# Patient Record
Sex: Male | Born: 1995 | Race: Black or African American | Hispanic: No | Marital: Single | Smoking: Never smoker
Health system: Southern US, Community
[De-identification: ages and names within clinical notes are randomized; demographics above are authoritative.]

---
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
## 2013-02-22 ENCOUNTER — Emergency Department (HOSPITAL_COMMUNITY): Payer: Medicaid Other

## 2013-02-22 ENCOUNTER — Encounter (HOSPITAL_COMMUNITY): Payer: Self-pay | Admitting: Emergency Medicine

## 2013-02-22 ENCOUNTER — Emergency Department (HOSPITAL_COMMUNITY)
Admission: EM | Admit: 2013-02-22 | Discharge: 2013-02-22 | Disposition: A | Discharge status: Home / Self Care | Payer: Medicaid Other | Attending: Emergency Medicine | Admitting: Emergency Medicine

## 2013-02-22 DIAGNOSIS — S41131A Puncture wound without foreign body of right upper arm, initial encounter: Secondary | ICD-10-CM

## 2013-02-22 DIAGNOSIS — F411 Generalized anxiety disorder: Secondary | ICD-10-CM | POA: Insufficient documentation

## 2013-02-22 DIAGNOSIS — S41109A Unspecified open wound of unspecified upper arm, initial encounter: Secondary | ICD-10-CM | POA: Insufficient documentation

## 2013-02-22 LAB — CBC WITH DIFFERENTIAL/PLATELET
Basophils Absolute: 0.1 10*3/uL (ref 0.0–0.1)
Basophils Relative: 1 % (ref 0–1)
Hemoglobin: 15.9 g/dL (ref 13.0–17.0)
Lymphocytes Relative: 31 % (ref 12–46)
MCHC: 34.4 g/dL (ref 30.0–36.0)
Monocytes Relative: 5 % (ref 3–12)
Neutro Abs: 4.6 10*3/uL (ref 1.7–7.7)
Neutrophils Relative %: 60 % (ref 43–77)
RDW: 13 % (ref 11.5–15.5)
WBC: 7.6 10*3/uL (ref 4.0–10.5)

## 2013-02-22 LAB — BASIC METABOLIC PANEL
Chloride: 98 mEq/L (ref 96–112)
GFR calc Af Amer: >90 mL/min (ref >90)
Potassium: 3.5 mEq/L (ref 3.5–5.1)

## 2013-02-22 MED ORDER — CEPHALEXIN 500 MG PO CAPS: 500.0000 mg | ORAL_CAPSULE | Freq: Two times a day (BID) | ORAL | Status: DC

## 2013-02-22 MED ORDER — SODIUM CHLORIDE 0.9 % IV BOLUS (SEPSIS)
1000.0000 mL | Freq: Once | INTRAVENOUS | Status: AC
  Administered 2013-02-22: 1000 mL via INTRAVENOUS

## 2013-02-22 MED ORDER — FENTANYL CITRATE 0.05 MG/ML IJ SOLN
50.0000 ug | Freq: Once | INTRAMUSCULAR | Status: AC
  Administered 2013-02-22: 50 ug via INTRAVENOUS
  Filled 2013-02-22: qty 2

## 2013-02-22 MED ORDER — IOHEXOL 350 MG/ML SOLN
100.0000 mL | Freq: Once | INTRAVENOUS | Status: AC | PRN
  Administered 2013-02-22: 100 mL via INTRAVENOUS

## 2013-02-22 MED ORDER — HYDROCODONE-ACETAMINOPHEN 5-325 MG PO TABS: 1.0000 | ORAL_TABLET | ORAL | Status: DC | PRN
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
## 2013-02-22 NOTE — ED Notes (Signed)
Pt back from CT, no pain, VSS, PD at bedside
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
## 2013-02-22 NOTE — ED Notes (Signed)
Bulk dressing applied to R upper arm.  Bleeding controlled.  No distress noted.

## 2013-02-22 NOTE — ED Notes (Signed)
NAD noted at time of d/c home 

## 2013-02-23 LAB — TYPE AND SCREEN
ABO/RH(D): B POS
DAT, IgG: NEGATIVE
Unit division: 0

## 2013-05-27 ENCOUNTER — Encounter (HOSPITAL_COMMUNITY): Payer: Self-pay | Admitting: Emergency Medicine

## 2013-05-27 ENCOUNTER — Emergency Department (HOSPITAL_COMMUNITY)
Admission: EM | Admit: 2013-05-27 | Discharge: 2013-05-27 | Disposition: A | Discharge status: Home / Self Care | Payer: Self-pay | Attending: Emergency Medicine | Admitting: Emergency Medicine

## 2013-05-27 DIAGNOSIS — K089 Disorder of teeth and supporting structures, unspecified: Secondary | ICD-10-CM | POA: Insufficient documentation

## 2013-05-27 DIAGNOSIS — K0889 Other specified disorders of teeth and supporting structures: Secondary | ICD-10-CM

## 2013-05-27 MED ORDER — TRAMADOL HCL 50 MG PO TABS: 50.0000 mg | ORAL_TABLET | Freq: Four times a day (QID) | ORAL | Status: DC | PRN

## 2013-05-27 MED ORDER — PENICILLIN V POTASSIUM 500 MG PO TABS: 500.0000 mg | ORAL_TABLET | Freq: Four times a day (QID) | ORAL | Status: AC

## 2013-05-27 NOTE — ED Notes (Signed)
Patient with history of toothache, upper right jaw.  Patient states that the tooth is broken.  Increased pain at this time.

## 2013-05-27 NOTE — ED Provider Notes (Signed)
   History    CSN: [id number] Arrival date & time 05/27/13  6213  First MD Initiated Contact with Patient 05/27/13 0324     Chief Complaint  Patient presents with  . Dental Pain   HPI  History provided by the patient. Patient is a 17 year old male with no significant PMH presenting with complaints of worsened right upper molar pain. Patient states he has had part of his mold broken for several months. He has had occasional intermittent discomforts since that time they gradually worsened over the past one or 2 days. Patient attempted to use over-the-counter dental filling to place in the broken area. He states this has only helped slightly. He has been taking Aleve regularly or ibuprofen for states this has not helped with pain tonight and this morning. He denies any associated fever, chills or sweats. No swelling of the face. No difficulty breathing or swallowing. No other aggravating or alleviating factors. No other associated symptoms.     History reviewed. No pertinent past medical history. History reviewed. No pertinent past surgical history. No family history on file. History  Substance Use Topics  . Smoking status: Never Smoker   . Smokeless tobacco: Not on file  . Alcohol Use: No    Review of Systems  Constitutional: Negative for fever, chills and diaphoresis.  All other systems reviewed and are negative.    Allergies  Review of patient's allergies indicates no known allergies.  Home Medications   Current Outpatient Rx  Name  Route  Sig  Dispense  Refill  . ibuprofen (ADVIL,MOTRIN) 200 MG tablet   Oral   Take 200-400 mg by mouth every 6 (six) hours as needed for pain.         . Naproxen Sodium (ALEVE PO)   Oral   Take 1-2 tablets by mouth every 12 (twelve) hours as needed (pain).          BP 120/80  Pulse 66  Temp(Src) 98.1 F (36.7 C) (Oral)  Resp 16  SpO2 100% Physical Exam  Nursing note and vitals reviewed. Constitutional: He is oriented to  person, place, and time. He appears well-developed and well-nourished. No distress.  HENT:  Head: Normocephalic.  Mouth/Throat:    Pain percussion of the right upper first molar. There is a white semisolid over-the-counter product placed within the molar. Adjacent gums without any significant swelling.  Neck: Normal range of motion. Neck supple.  Cardiovascular: Normal rate and regular rhythm.   Pulmonary/Chest: Effort normal and breath sounds normal.  Abdominal: Soft.  Lymphadenopathy:    He has no cervical adenopathy.  Neurological: He is alert and oriented to person, place, and time.  Skin: Skin is warm.  Psychiatric: He has a normal mood and affect. His behavior is normal.    ED Course  Procedures   Dental Block Performed by: Angus Seller Authorized by: Angus Seller Consent: Verbal consent obtained. Risks and benefits: risks, benefits and alternatives were discussed Consent given by: patient Patient identity confirmed: provided demographic data  Location: Right upper first molar  Local anesthetic: Bupivacaine 0.5% with epinephrine  Anesthetic total: 1.8 ml  Irrigation method: syringe  Patient tolerance: Patient tolerated the procedure well with no immediate complications. Pain improved.    1. Pain, dental     MDM  Patient seen and evaluated. Patient appears well in no acute distress.  Angus Seller, PA-C 05/27/13 [phone]

## 2013-05-27 NOTE — ED Provider Notes (Signed)
Medical screening examination/treatment/procedure(s) were performed by non-physician practitioner and as supervising physician I was immediately available for consultation/collaboration.   Layan Zalenski, MD 05/27/13 0641 

## 2014-07-26 ENCOUNTER — Emergency Department (HOSPITAL_BASED_OUTPATIENT_CLINIC_OR_DEPARTMENT_OTHER)
Admission: EM | Admit: 2014-07-26 | Discharge: 2014-07-26 | Disposition: A | Discharge status: Home / Self Care | Payer: BC Managed Care – PPO | Attending: Emergency Medicine | Admitting: Emergency Medicine

## 2014-07-26 ENCOUNTER — Emergency Department (HOSPITAL_BASED_OUTPATIENT_CLINIC_OR_DEPARTMENT_OTHER): Payer: BC Managed Care – PPO

## 2014-07-26 ENCOUNTER — Encounter (HOSPITAL_BASED_OUTPATIENT_CLINIC_OR_DEPARTMENT_OTHER): Payer: Self-pay | Admitting: Emergency Medicine

## 2014-07-26 DIAGNOSIS — J069 Acute upper respiratory infection, unspecified: Secondary | ICD-10-CM | POA: Diagnosis not present

## 2014-07-26 DIAGNOSIS — M549 Dorsalgia, unspecified: Secondary | ICD-10-CM | POA: Insufficient documentation

## 2014-07-26 DIAGNOSIS — R059 Cough, unspecified: Secondary | ICD-10-CM | POA: Diagnosis present

## 2014-07-26 DIAGNOSIS — J209 Acute bronchitis, unspecified: Secondary | ICD-10-CM | POA: Diagnosis not present

## 2014-07-26 DIAGNOSIS — R110 Nausea: Secondary | ICD-10-CM | POA: Diagnosis not present

## 2014-07-26 DIAGNOSIS — R05 Cough: Secondary | ICD-10-CM | POA: Diagnosis present

## 2014-07-26 MED ORDER — DM-GUAIFENESIN ER 30-600 MG PO TB12: 1.0000 | ORAL_TABLET | Freq: Two times a day (BID) | ORAL | Status: DC

## 2014-07-26 MED ORDER — ALBUTEROL SULFATE HFA 108 (90 BASE) MCG/ACT IN AERS
2.0000 | INHALATION_SPRAY | Freq: Four times a day (QID) | RESPIRATORY_TRACT | Status: DC
Start: 2014-07-26 — End: 2014-07-26
  Administered 2014-07-26: 2 via RESPIRATORY_TRACT
  Filled 2014-07-26: qty 6.7

## 2014-07-26 NOTE — Discharge Instructions (Signed)
Use albuterol inhaler 2 puffs every 6 hours for at least the next 7 days then as needed. Use the Mucinex DM as directed. Work note provided. Return for any newer worse symptoms. Also can use Motrin for any bodyaches sore throat or discomfort.

## 2014-07-26 NOTE — ED Notes (Signed)
Reports cough, congestions. Respirations even and unlabored.

## 2014-07-26 NOTE — ED Provider Notes (Signed)
CSN: [id number]     Arrival date & time 07/26/14  1016 History   First MD Initiated Contact with Patient 07/26/14 1042     Chief Complaint  Patient presents with  . URI     (Consider location/radiation/quality/duration/timing/severity/associated sxs/prior Treatment) Patient is a 18 y.o. male presenting with URI. The history is provided by the patient.  URI Presenting symptoms: congestion and cough   Presenting symptoms: no fever   Associated symptoms: wheezing   Associated symptoms: no headaches and no myalgias    patient with several day history of upper respiratory type infection with cough congestion occasional nausea. Patient feels as if he's been having difficulty breathing more so in the last 24 hours. Patient's had a history of reactive airway disease in the past but never formally diagnosed with asthma. Patient feels as if he has been wheezing. Patient denies fever. Denies nominal pain but has had some nausea no vomiting no diarrhea.  History reviewed. No pertinent past medical history. History reviewed. No pertinent past surgical history. No family history on file. History  Substance Use Topics  . Smoking status: Never Smoker   . Smokeless tobacco: Not on file  . Alcohol Use: No    Review of Systems  Constitutional: Negative for fever.  HENT: Positive for congestion.   Eyes: Negative for redness.  Respiratory: Positive for cough, shortness of breath and wheezing.   Cardiovascular: Negative for chest pain.  Gastrointestinal: Positive for nausea. Negative for vomiting and abdominal pain.  Musculoskeletal: Positive for back pain. Negative for myalgias.  Skin: Negative for rash.  Neurological: Negative for headaches.  Hematological: Does not bruise/bleed easily.  Psychiatric/Behavioral: Negative for confusion.      Allergies  Review of patient's allergies indicates no known allergies.  Home Medications   Prior to Admission medications   Medication Sig Start Date  End Date Taking? Authorizing Provider  dextromethorphan-guaiFENesin (MUCINEX DM) 30-600 MG per 12 hr tablet Take 1 tablet by mouth 2 (two) times daily. 07/26/14   Vanetta Mulders, MD  ibuprofen (ADVIL,MOTRIN) 200 MG tablet Take 200-400 mg by mouth every 6 (six) hours as needed for pain.    Historical Provider, MD  Naproxen Sodium (ALEVE PO) Take 1-2 tablets by mouth every 12 (twelve) hours as needed (pain).    Historical Provider, MD  traMADol (ULTRAM) 50 MG tablet Take 1 tablet (50 mg total) by mouth every 6 (six) hours as needed for pain. 05/27/13   Phill Mutter Dammen, PA-C   BP 119/59  Pulse 87  Temp(Src) 99.3 F (37.4 C) (Oral)  Resp 20  Ht 6' (1.829 m)  Wt 160 lb (72.576 kg)  BMI 21.70 kg/m2  SpO2 100% Physical Exam  Nursing note and vitals reviewed. Constitutional: He is oriented to person, place, and time. He appears well-developed and well-nourished. No distress.  HENT:  Head: Normocephalic and atraumatic.  Mouth/Throat: Oropharynx is clear and moist. No oropharyngeal exudate.  Eyes: Conjunctivae and EOM are normal. Pupils are equal, round, and reactive to light.  Neck: Normal range of motion.  Cardiovascular: Normal rate, regular rhythm and normal heart sounds.   Pulmonary/Chest: Effort normal and breath sounds normal. No respiratory distress. He has no wheezes. He has no rales.  Bilateral rhonchi.  Abdominal: Soft. Bowel sounds are normal. There is no tenderness.  Musculoskeletal: Normal range of motion.  Neurological: He is alert and oriented to person, place, and time. No cranial nerve deficit. He exhibits normal muscle tone. Coordination normal.  Skin: Skin is warm. No  rash noted.    ED Course  Procedures (including critical care time) Labs Review Labs Reviewed - No data to display  Imaging Review Dg Chest 2 View  07/26/2014   CLINICAL DATA:  Cough, sore throat, wheezing  EXAM: CHEST  2 VIEW  COMPARISON:  None.  FINDINGS: The heart size and mediastinal contours are  within normal limits. Both lungs are clear. The visualized skeletal structures are unremarkable.  IMPRESSION: No active cardiopulmonary disease.   Electronically Signed   By: Elige Ko   On: 07/26/2014 11:25     EKG Interpretation None      MDM   Final diagnoses:  URI (upper respiratory infection)  Bronchitis, acute, with bronchospasm   Patient without any wheezing here but clinically has a bronchitis and patient subjectively states he has been wheezing. Will treat with albuterol inhaler for the next 7 days. Also treated with Mucinex DM. Chest x-ray negative for pneumonia. No hypoxia here. Patient nontoxic no acute distress.     Vanetta Mulders, MD 07/26/14 1231

## 2015-01-17 ENCOUNTER — Encounter (HOSPITAL_COMMUNITY): Payer: Self-pay | Admitting: Emergency Medicine

## 2015-01-17 ENCOUNTER — Emergency Department (HOSPITAL_COMMUNITY)
Admission: EM | Admit: 2015-01-17 | Discharge: 2015-01-17 | Disposition: A | Discharge status: Home / Self Care | Payer: BLUE CROSS/BLUE SHIELD | Attending: Emergency Medicine | Admitting: Emergency Medicine

## 2015-01-17 DIAGNOSIS — A084 Viral intestinal infection, unspecified: Secondary | ICD-10-CM

## 2015-01-17 DIAGNOSIS — R112 Nausea with vomiting, unspecified: Secondary | ICD-10-CM | POA: Diagnosis present

## 2015-01-17 DIAGNOSIS — Z79899 Other long term (current) drug therapy: Secondary | ICD-10-CM | POA: Insufficient documentation

## 2015-01-17 LAB — COMPREHENSIVE METABOLIC PANEL
ALT: 16 U/L (ref 0–53)
AST: 21 U/L (ref 0–37)
Albumin: 4.5 g/dL (ref 3.5–5.2)
Alkaline Phosphatase: 62 U/L (ref 39–117)
Anion gap: 7 (ref 5–15)
BUN: 13 mg/dL (ref 6–23)
CHLORIDE: 105 mmol/L (ref 96–112)
CO2: 26 mmol/L (ref 19–32)
CREATININE: 0.95 mg/dL (ref 0.50–1.35)
Calcium: 9.2 mg/dL (ref 8.4–10.5)
GFR calc non Af Amer: >90 mL/min (ref >90)
Glucose, Bld: 94 mg/dL (ref 70–99)
Potassium: 3.7 mmol/L (ref 3.5–5.1)
Sodium: 138 mmol/L (ref 135–145)
Total Bilirubin: 0.5 mg/dL (ref 0.3–1.2)
Total Protein: 8 g/dL (ref 6.0–8.3)

## 2015-01-17 LAB — CBC WITH DIFFERENTIAL/PLATELET
BASOS ABS: 0 10*3/uL (ref 0.0–0.1)
BASOS PCT: 0 % (ref 0–1)
EOS ABS: 0.1 10*3/uL (ref 0.0–0.7)
Eosinophils Relative: 2 % (ref 0–5)
HCT: 44.5 % (ref 39.0–52.0)
Hemoglobin: 14.9 g/dL (ref 13.0–17.0)
LYMPHS ABS: 1.4 10*3/uL (ref 0.7–4.0)
Lymphocytes Relative: 24 % (ref 12–46)
MCH: 28.6 pg (ref 26.0–34.0)
MCHC: 33.5 g/dL (ref 30.0–36.0)
MCV: 85.4 fL (ref 78.0–100.0)
Monocytes Absolute: 0.4 10*3/uL (ref 0.1–1.0)
Monocytes Relative: 7 % (ref 3–12)
NEUTROS PCT: 67 % (ref 43–77)
Neutro Abs: 3.8 10*3/uL (ref 1.7–7.7)
PLATELETS: 276 10*3/uL (ref 150–400)
RBC: 5.21 MIL/uL (ref 4.22–5.81)
RDW: 13.2 % (ref 11.5–15.5)
WBC: 5.7 10*3/uL (ref 4.0–10.5)

## 2015-01-17 LAB — LIPASE, BLOOD: LIPASE: 24 U/L (ref 11–59)

## 2015-01-17 MED ORDER — SODIUM CHLORIDE 0.9 % IV SOLN
Freq: Once | INTRAVENOUS | Status: AC
  Administered 2015-01-17: 17:00:00 via INTRAVENOUS

## 2015-01-17 MED ORDER — OMEPRAZOLE 20 MG PO CPDR: 20.0000 mg | DELAYED_RELEASE_CAPSULE | Freq: Every day | ORAL | Status: DC

## 2015-01-17 MED ORDER — ONDANSETRON HCL 4 MG PO TABS
4.0000 mg | ORAL_TABLET | Freq: Once | ORAL | Status: AC
Start: 2015-01-17 — End: 2015-01-17
  Administered 2015-01-17: 4 mg via ORAL
  Filled 2015-01-17: qty 1

## 2015-01-17 NOTE — ED Provider Notes (Signed)
CSN: [id number]     Arrival date & time 01/17/15  1439 History   First MD Initiated Contact with Patient 01/17/15 1522     Chief Complaint  Patient presents with  . Abdominal Pain  . Nausea  . Emesis  . Diarrhea     (Consider location/radiation/quality/duration/timing/severity/associated sxs/prior Treatment) The history is provided by the patient. No language interpreter was used.  Mr. Bass presents with gradual onset nausea, vomiting x 3 , diarrhea x 3, chills, and abdominal pain for the past 3 days. He also has subjective fever for the past 2 days. He denies any shortness of breath, abdominal distention, bilious vomiting, melena, or hematemesis.  He had no prior treatment.   History reviewed. No pertinent past medical history. No past surgical history on file. No family history on file. History  Substance Use Topics  . Smoking status: Never Smoker   . Smokeless tobacco: Not on file  . Alcohol Use: No    Review of Systems  Constitutional: Positive for fever and chills.  HENT: Positive for rhinorrhea. Negative for ear pain and sore throat.   Respiratory: Negative for cough and shortness of breath.   Gastrointestinal: Positive for nausea, vomiting, abdominal pain and diarrhea. Negative for blood in stool and abdominal distention.  Neurological: Positive for dizziness. Negative for light-headedness and headaches.  All other systems reviewed and are negative.     Allergies  Review of patient's allergies indicates no known allergies.  Home Medications   Prior to Admission medications   Medication Sig Start Date End Date Taking? Authorizing Provider  ibuprofen (ADVIL,MOTRIN) 200 MG tablet Take 200-400 mg by mouth every 6 (six) hours as needed for pain.   Yes Historical Provider, MD  Naproxen Sodium (ALEVE PO) Take 1-2 tablets by mouth every 12 (twelve) hours as needed (pain).   Yes Historical Provider, MD  dextromethorphan-guaiFENesin (MUCINEX DM) 30-600 MG per 12 hr tablet  Take 1 tablet by mouth 2 (two) times daily. 07/26/14   Vanetta Mulders, MD  omeprazole (PRILOSEC) 20 MG capsule Take 1 capsule (20 mg total) by mouth daily. 01/17/15   Baleria Wyman Patel-Mills, PA-C  traMADol (ULTRAM) 50 MG tablet Take 1 tablet (50 mg total) by mouth every 6 (six) hours as needed for pain. 05/27/13   Phill Mutter Dammen, PA-C   BP 153/85 mmHg  Pulse 76  Temp(Src) 97.9 F (36.6 C) (Oral)  Resp 18  SpO2 100% Physical Exam  Constitutional: He is oriented to person, place, and time. He appears well-developed and well-nourished.  HENT:  Head: Normocephalic and atraumatic.  Right Ear: External ear normal.  Left Ear: External ear normal.  Mouth/Throat: Oropharynx is clear and moist.  Eyes: Conjunctivae are normal.  Neck: Normal range of motion. Neck supple.  Cardiovascular: Normal rate, regular rhythm and normal heart sounds.   Pulmonary/Chest: Effort normal and breath sounds normal.  Abdominal: Soft. Normal appearance. He exhibits no distension and no mass. There is no tenderness. There is no rebound, no guarding and no CVA tenderness.  Musculoskeletal: Normal range of motion.  Neurological: He is alert and oriented to person, place, and time.  Skin: Skin is warm and dry.  Nursing note and vitals reviewed.   ED Course  Procedures (including critical care time) Labs Review Labs Reviewed  CBC WITH DIFFERENTIAL/PLATELET  COMPREHENSIVE METABOLIC PANEL  LIPASE, BLOOD    Imaging Review No results found.   EKG Interpretation None      MDM   Final diagnoses:  Viral gastroenteritis  Vitals  within normal limits.  Labs: CBC, CMP, Lipase all normal.  Patient able to tolerate PO fluids. Discharged with a PPI.       Catha GosselinHanna Patel-Mills, PA-C 01/17/15 2352  Gerhard Munchobert Lockwood, MD 01/18/15 [phone]

## 2015-01-17 NOTE — Progress Notes (Signed)
EDCM spoke to patient at bedside.  Patient confirms he has [employer] insurance without a pcp.  [facility] of [facility]ps who accept BCBS insurance within a ten mile radius of patient's zip code [phone].  Dicussed importance and purpose of having a pcp, use of uregent care facilities and ED for emergencies.  Patient verbalized understanding.  No further EDCM needs at this time.

## 2015-01-17 NOTE — ED Notes (Signed)
Pt c/o gen abd pain, NVD, dizziness x 3 days.

## 2015-01-17 NOTE — Discharge Instructions (Signed)
°  Gastritis, Adult Gastritis is soreness and puffiness (inflammation) of the lining of the stomach. If you do not get help, gastritis can cause bleeding and sores (ulcers) in the stomach. HOME CARE   Only take medicine as told by your doctor.  If you were given antibiotic medicines, take them as told. Finish the medicines even if you start to feel better.  Drink enough fluids to keep your pee (urine) clear or pale yellow.  Avoid foods and drinks that make your problems worse. Foods you may want to avoid include:  Caffeine or alcohol.  Chocolate.  Mint.  Garlic and onions.  Spicy foods.  Citrus fruits, including oranges, lemons, or limes.  Food containing tomatoes, including sauce, chili, salsa, and pizza.  Fried and fatty foods.  Eat small meals throughout the day instead of large meals. GET HELP RIGHT AWAY IF:   You have black or dark red poop (stools).  You throw up (vomit) blood. It may look like coffee grounds.  You cannot keep fluids down.  Your belly (abdominal) pain gets worse.  You have a fever.  You do not feel better after 1 week.  You have any other questions or concerns. MAKE SURE YOU:   Understand these instructions.  Will watch your condition.  Will get help right away if you are not doing well or get worse. Document Released: 04/20/2008 Document Revised: 01/25/2012 Document Reviewed: 12/16/2011 [facility] Patient Information 2015 [employer] [place]. This information is not intended to replace advice given to you by your health care provider. Make sure you discuss any questions you have with your health care provider.  Meds: Omeprazole Drink plenty of fluids.

## 2015-01-17 NOTE — ED Notes (Signed)
Pt has been told that a urine sample is needed from him, urinal is at the bedside 

## 2015-09-03 IMAGING — CR DG CHEST 2V
2 series · 2 of 2 positions shown · non-contrast
Comparison: None.

CLINICAL DATA: Cough, sore throat, wheezing

EXAM:
CHEST  2 VIEW

[w chest pa]
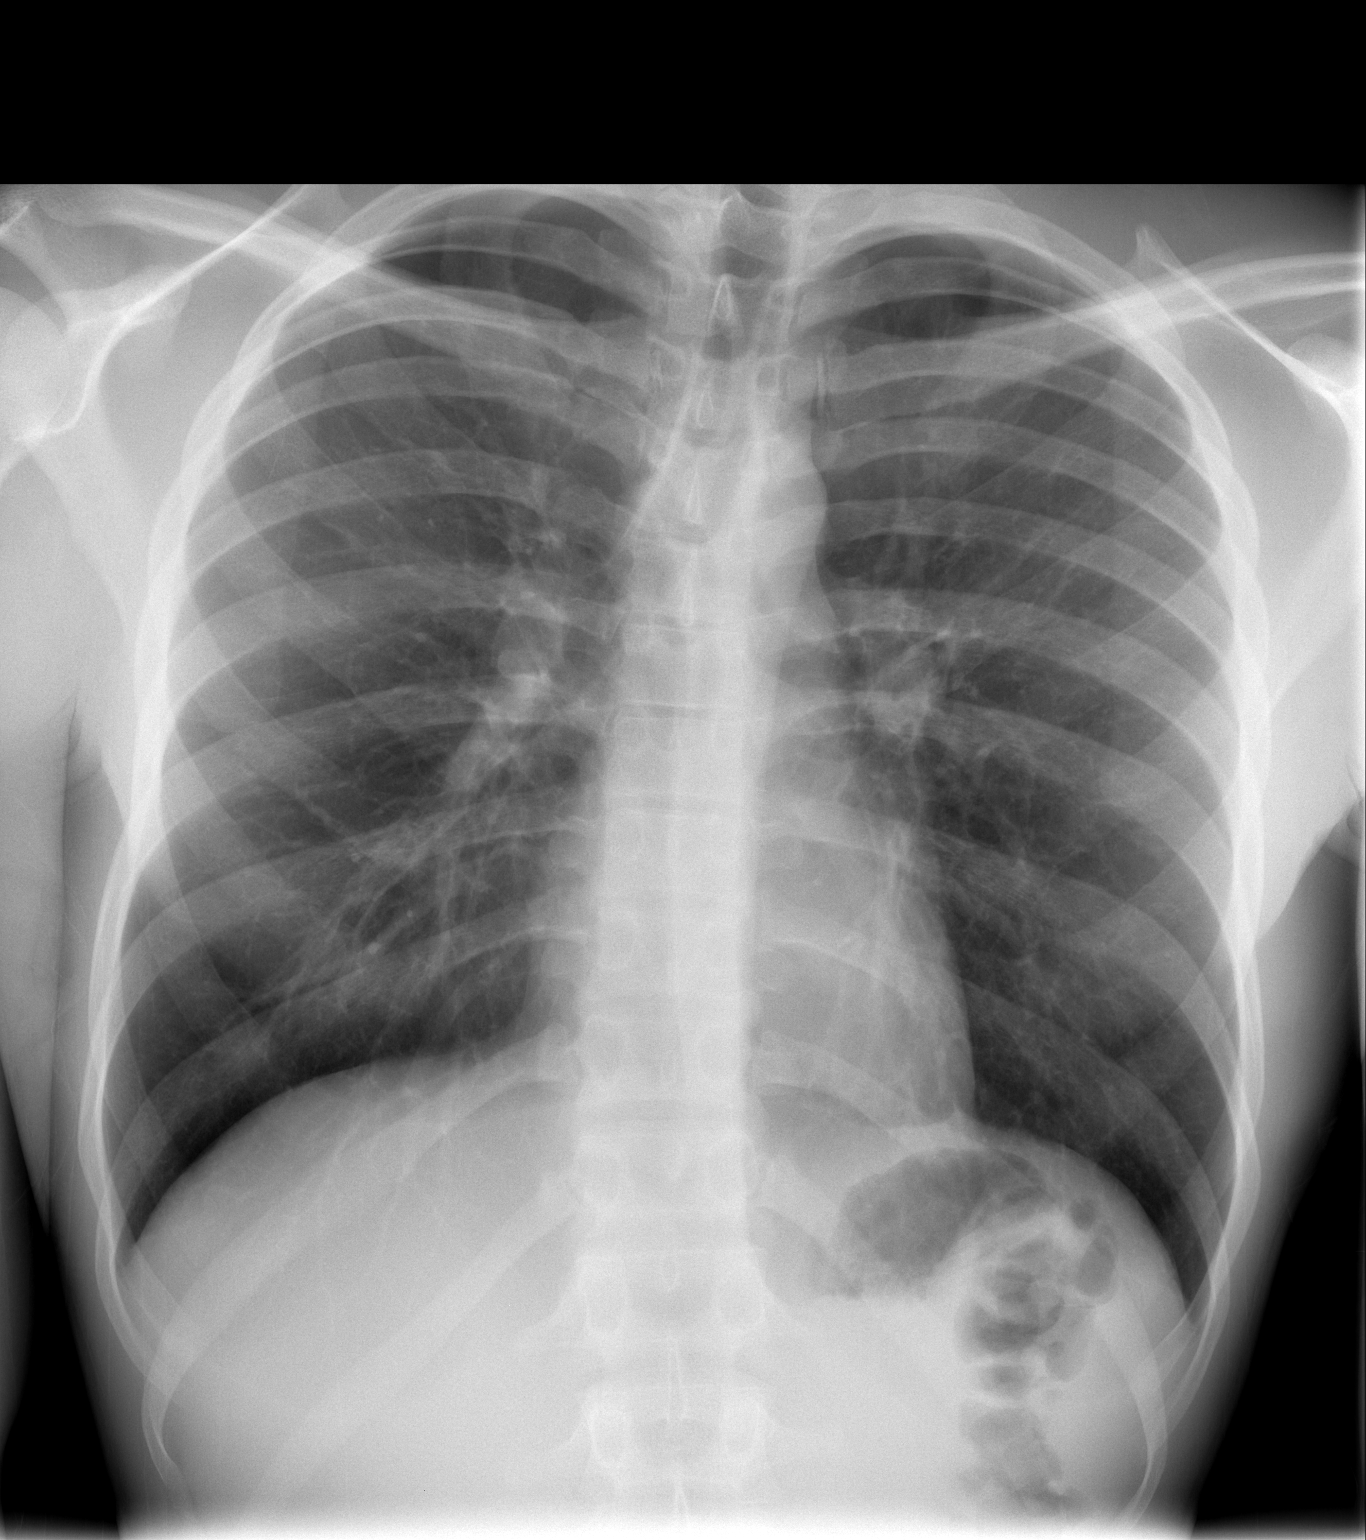

[w chest lat]
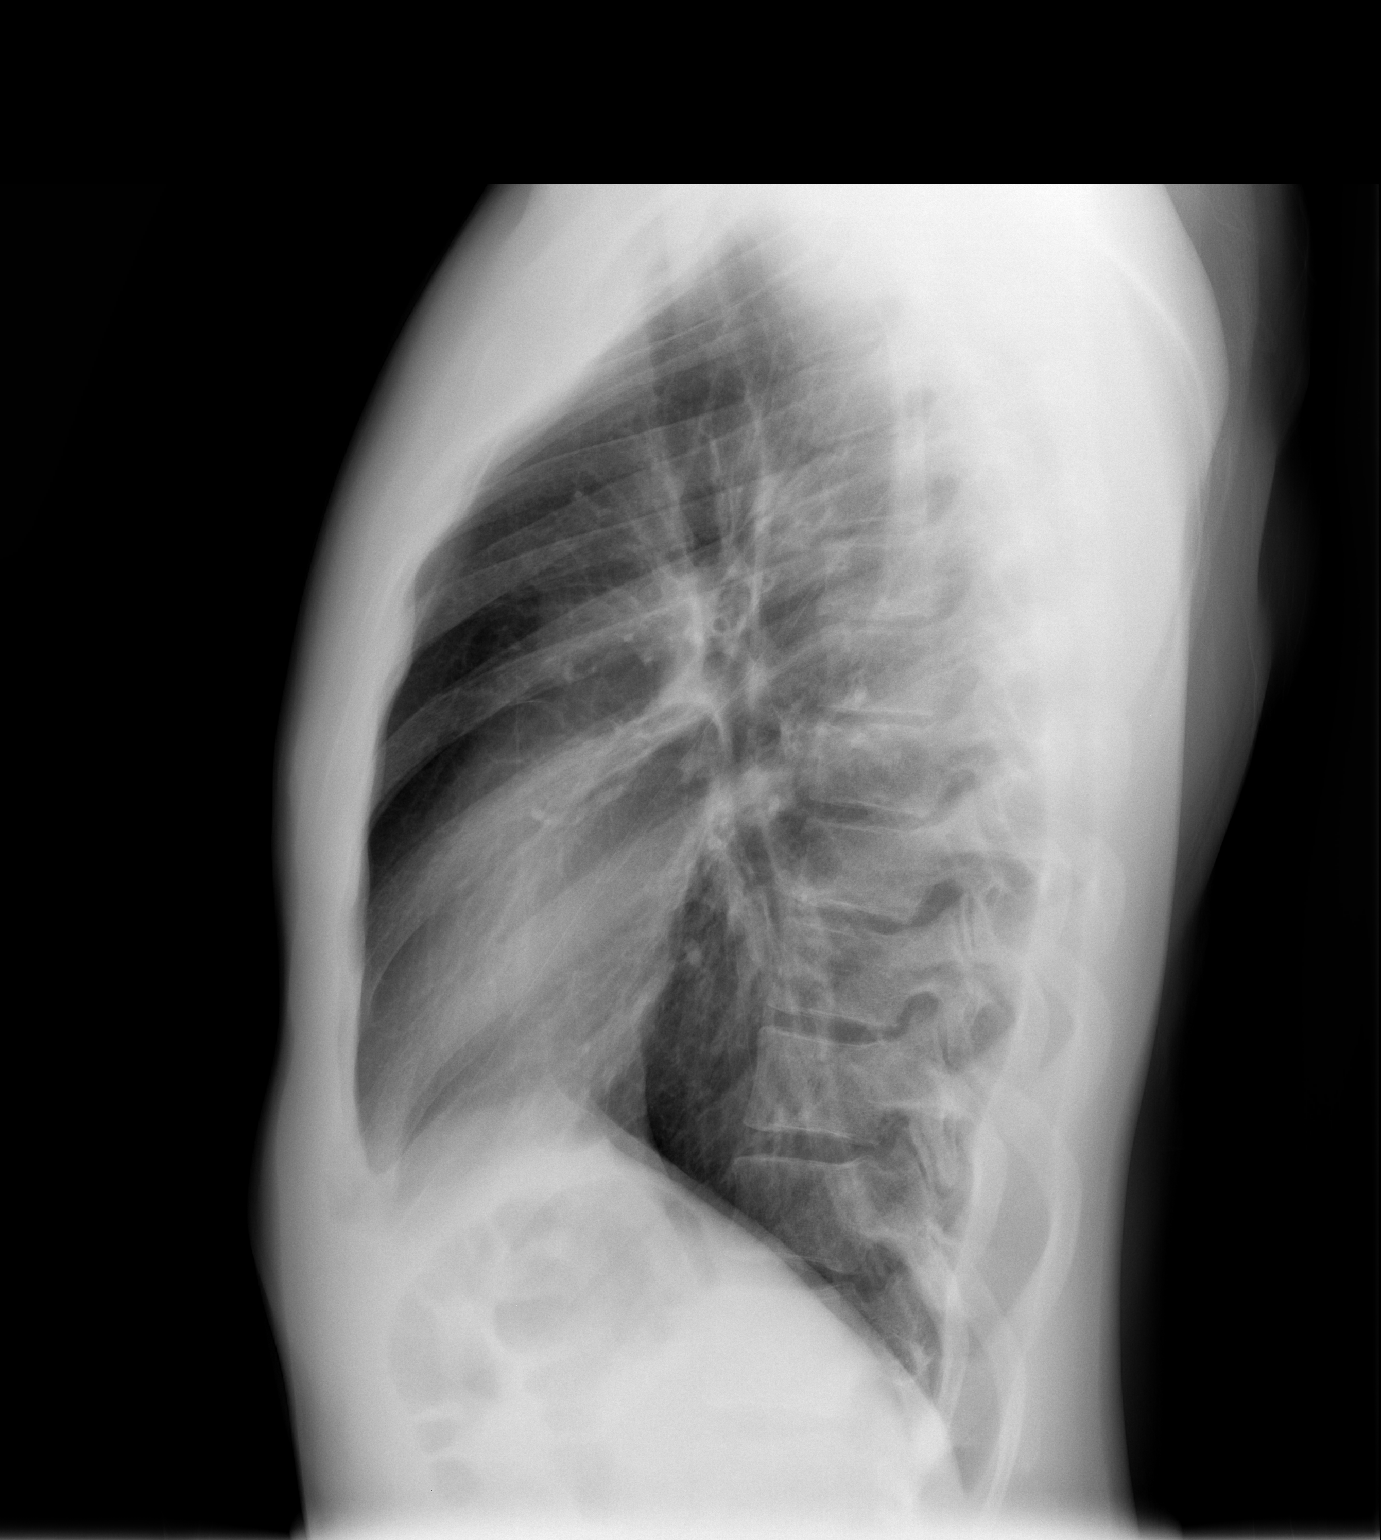

[2 of 2 positions shown; findings below may reference images not displayed]

FINDINGS: The heart size and mediastinal contours are within normal limits.
Both lungs are clear. The visualized skeletal structures are
unremarkable.
IMPRESSION: No active cardiopulmonary disease.

## 2018-03-14 ENCOUNTER — Encounter (HOSPITAL_COMMUNITY): Payer: Self-pay

## 2018-03-14 ENCOUNTER — Emergency Department (HOSPITAL_COMMUNITY): Payer: BLUE CROSS/BLUE SHIELD

## 2018-03-14 ENCOUNTER — Emergency Department (HOSPITAL_COMMUNITY)
Admission: EM | Admit: 2018-03-14 | Discharge: 2018-03-14 | Disposition: A | Discharge status: Home / Self Care | Payer: BLUE CROSS/BLUE SHIELD | Attending: Emergency Medicine | Admitting: Emergency Medicine

## 2018-03-14 ENCOUNTER — Other Ambulatory Visit: Payer: Self-pay

## 2018-03-14 DIAGNOSIS — Z79899 Other long term (current) drug therapy: Secondary | ICD-10-CM | POA: Insufficient documentation

## 2018-03-14 DIAGNOSIS — M25511 Pain in right shoulder: Secondary | ICD-10-CM | POA: Insufficient documentation
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
## 2021-04-24 ENCOUNTER — Emergency Department (HOSPITAL_COMMUNITY)
Admission: EM | Admit: 2021-04-24 | Discharge: 2021-04-24 | Disposition: A | Discharge status: Home / Self Care | Payer: Self-pay | Attending: Emergency Medicine | Admitting: Emergency Medicine

## 2021-04-24 ENCOUNTER — Other Ambulatory Visit: Payer: Self-pay

## 2021-04-24 ENCOUNTER — Emergency Department (HOSPITAL_COMMUNITY): Payer: Self-pay

## 2021-04-24 ENCOUNTER — Encounter (HOSPITAL_COMMUNITY): Payer: Self-pay

## 2021-04-24 DIAGNOSIS — S92312A Displaced fracture of first metatarsal bone, left foot, initial encounter for closed fracture: Secondary | ICD-10-CM | POA: Insufficient documentation

## 2021-04-24 DIAGNOSIS — W208XXA Other cause of strike by thrown, projected or falling object, initial encounter: Secondary | ICD-10-CM | POA: Insufficient documentation

## 2021-04-24 MED ORDER — HYDROCODONE-ACETAMINOPHEN 5-325 MG PO TABS
1.0000 | ORAL_TABLET | Freq: Once | ORAL | Status: AC
  Administered 2021-04-24: 1 via ORAL
  Filled 2021-04-24: qty 1

## 2021-04-24 MED ORDER — IBUPROFEN 200 MG PO TABS
600.0000 mg | ORAL_TABLET | Freq: Once | ORAL | Status: AC
  Administered 2021-04-24: 600 mg via ORAL
  Filled 2021-04-24: qty 3

## 2021-04-24 MED ORDER — IBUPROFEN 600 MG PO TABS: 600.0000 mg | ORAL_TABLET | Freq: Four times a day (QID) | ORAL | 0 refills | Status: AC | PRN

## 2021-04-24 MED ORDER — HYDROCODONE-ACETAMINOPHEN 5-325 MG PO TABS: 1.0000 | ORAL_TABLET | Freq: Four times a day (QID) | ORAL | 0 refills | Status: AC | PRN

## 2021-04-24 NOTE — ED Notes (Signed)
Ortho called for boot.

## 2021-04-24 NOTE — Discharge Instructions (Addendum)
You should remain in cam walker boot, do not bear weight on the left foot and use crutches.  You can elevate foot and apply ice.  For pain take ibuprofen 600 mg and Tylenol 650 mg every 6 hours.  You can use prescribed hydrocodone as needed for more severe breakthrough pain.  Please call to schedule close follow-up appointment with Dr. Carola Frost with orthopedics for further evaluation of fracture.

## 2021-04-24 NOTE — ED Triage Notes (Signed)
Pt presents to ED after piano fell from dolly onto left foot. Visible edema, bruising to left foot, difficulty moving toes and ankle due to swelling. Endorses tingling and burning from mid-foot to toes that worsens with certain movements.

## 2021-04-24 NOTE — Progress Notes (Signed)
Orthopedic Tech Progress Note Patient Details:  Lee Thomas June 22, 1996 [id number]  Ortho Devices Type of Ortho Device: CAM walker Ortho Device/Splint Location: left Ortho Device/Splint Interventions: Application   Post Interventions Patient Tolerated: Well Instructions Provided: Care of device  Saul Fordyce 04/24/2021, 11:29 AM

## 2021-04-24 NOTE — ED Provider Notes (Signed)
[facility]-EMERGENCY DEPT Provider Note   CSN: [id number] Arrival date & time: 04/24/21  2505     History Chief Complaint  Patient presents with   Foot Injury    Lee Thomas is a 25 y.o. male.  Lee Thomas is a 24 y.o. male who is otherwise healthy, presents to the ED for evaluation of left foot injury.  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  He reports initially he felt like his foot was fine, but woke up this morning with bruising, swelling and worsening pain primarily over the top of the foot.  He denies any pain over the ankle.  Reports with some movements he has some tingling in the foot but no persistent numbness.  No wounds or bleeding.  No bruising noted over the bottom of the foot.  He had some crutches from a prior injury that he has been using this morning because it is very painful to bear weight.  No medications prior to arrival.  No other aggravating or alleviating factors.  The history is provided by the patient.      History reviewed. No pertinent past medical history.  There are no problems to display for this patient.   History reviewed. No pertinent surgical history.     No family history on file.  Social History   Tobacco Use   Smoking status: Never   Smokeless tobacco: Never  Vaping Use   Vaping Use: Never used  Substance Use Topics   Alcohol use: Yes    Alcohol/week: 4.0 standard drinks    Types: 4 Standard drinks or equivalent per week   Drug use: Yes    Types: Marijuana    Home Medications Prior to Admission medications   Medication Sig Start Date End Date Taking? Authorizing Provider  ibuprofen (ADVIL,MOTRIN) 200 MG tablet Take 200-400 mg by mouth every 6 (six) hours as needed for pain.   Yes [provider]    Allergies    Patient has no known allergies.  Review of Systems   Review of Systems   Constitutional:  Negative for chills and fever.  Musculoskeletal:  Positive for arthralgias and joint swelling.  Skin:  Positive for color change. Negative for wound.  Neurological:  Negative for weakness and numbness.   Physical Exam Updated Vital Signs BP 132/75 (BP Location: Right Arm)   Pulse 67   Temp 98.7 F (37.1 C) (Oral)   Resp 16   Ht 6' (1.829 m)   Wt 77.1 kg   SpO2 99%   BMI 23.06 kg/m   Physical Exam Vitals and nursing note reviewed.  Constitutional:      General: He is not in acute distress.    Appearance: Normal appearance. He is well-developed and normal weight. He is not ill-appearing or diaphoretic.  HENT:     Head: Normocephalic and atraumatic.  Eyes:     General:        Right eye: No discharge.        Left eye: No discharge.  Cardiovascular:     Pulses: Normal pulses.  Pulmonary:     Effort: Pulmonary effort is normal. No respiratory distress.  Musculoskeletal:        General: Swelling and tenderness present.     Comments: Left foot with swelling and bruising over the top of the foot, tender to palpation without obvious palpable deformity.  Due to edema difficult to palpate pulses, but dopplered DP pulses strong.  Patient with normal cap refill in all toes.  Normal sensation.  Pain worsened with dorsi and plantar flexion of the foot but no bony tenderness over the malleoli or calcaneus.  No bruising over the sole of the foot.  Neurological:     Mental Status: He is alert and oriented to person, place, and time.     Coordination: Coordination normal.  Psychiatric:        Mood and Affect: Mood normal.        Behavior: Behavior normal.    ED Results / Procedures / Treatments   Labs (all labs ordered are listed, but only abnormal results are displayed) Labs Reviewed - No data to display  EKG None  Radiology DG Foot Complete Left  Result Date: 04/24/2021 CLINICAL DATA:  Piano fell on foot EXAM: LEFT FOOT - COMPLETE 3+ VIEW COMPARISON:  None.  FINDINGS: Frontal, oblique, and lateral views were obtained. There is marked soft tissue swelling in the midfoot and forefoot regions. There is a spiral fracture extending from proximal and distal aspects of the first metatarsal with medial displacement distally. No other fractures are evident. No dislocation. Joint spaces appear normal. No erosive change. IMPRESSION: Spiral fracture first metatarsal medial displacement distally. Marked soft tissue swelling. No other fracture. No dislocation. No evident arthropathy. Electronically Signed   By: Bretta Bang III M.D.   On: 04/24/2021 09:44    Procedures Procedures   Medications Ordered in ED Medications  ibuprofen (ADVIL) tablet 600 mg (600 mg Oral Given 04/24/21 0928)  HYDROcodone-acetaminophen (NORCO/VICODIN) 5-325 MG per tablet 1 tablet (1 tablet Oral Given 04/24/21 8546)    ED Course  I have reviewed the triage vital signs and the nursing notes.  Pertinent labs & imaging results that were available during my care of the patient were reviewed by me and considered in my medical decision making (see chart for details).    MDM Rules/Calculators/A&P                         25 year old male presents with left foot injury after PNO fell on it while moving furniture.  There is obvious swelling and bruising with tenderness primarily to the top of the foot, no bony tenderness over the ankle.  Suspect underlying fracture.  Will get x-ray.  Pain treated in the ED.  Foot is neurovascularly intact  X-ray with spiral fracture of the first metatarsal with medial displacement distally and marked soft tissue swelling.  Compartments are soft without signs of compartment syndrome on exam.  Case discussed with Dr. Carola Frost on-call for orthopedics who reviewed imaging.  Recommends immobilization in postop shoe or cam walker, patient will need to be nonweightbearing and he will see them in the office for close follow-up early next week.  I discussed this plan with  patient, he is placed in cam walker boot and already has crutches here with him.  Prescription sent in for pain management encouraged ice and elevation and discussed return precautions and follow-up.  Patient discharged home in good condition.  Final Clinical Impression(s) / ED Diagnoses Final diagnoses:  Closed displaced fracture of first metatarsal bone of left foot, initial encounter    Rx / DC Orders ED Discharge Orders          Ordered    ibuprofen (ADVIL) 600 MG tablet  Every 6 hours PRN  04/24/21 1146    HYDROcodone-acetaminophen (NORCO) 5-325 MG tablet  Every 6 hours PRN        04/24/21 1146             Dartha Lodge, [place] 04/24/21 1722    Vanetta Mulders, MD 04/30/21 1559

## 2023-03-11 ENCOUNTER — Encounter (HOSPITAL_BASED_OUTPATIENT_CLINIC_OR_DEPARTMENT_OTHER): Payer: Self-pay

## 2023-03-11 ENCOUNTER — Emergency Department (HOSPITAL_BASED_OUTPATIENT_CLINIC_OR_DEPARTMENT_OTHER): Payer: Self-pay | Admitting: Radiology

## 2023-03-11 ENCOUNTER — Other Ambulatory Visit: Payer: Self-pay

## 2023-03-11 ENCOUNTER — Emergency Department (HOSPITAL_BASED_OUTPATIENT_CLINIC_OR_DEPARTMENT_OTHER)
Admission: EM | Admit: 2023-03-11 | Discharge: 2023-03-12 | Disposition: A | Discharge status: Home / Self Care | Payer: Self-pay | Attending: Emergency Medicine | Admitting: Emergency Medicine

## 2023-03-11 DIAGNOSIS — R531 Weakness: Secondary | ICD-10-CM | POA: Insufficient documentation

## 2023-03-11 DIAGNOSIS — Z1152 Encounter for screening for COVID-19: Secondary | ICD-10-CM | POA: Insufficient documentation

## 2023-03-11 DIAGNOSIS — K529 Noninfective gastroenteritis and colitis, unspecified: Secondary | ICD-10-CM | POA: Insufficient documentation

## 2023-03-11 DIAGNOSIS — R059 Cough, unspecified: Secondary | ICD-10-CM | POA: Insufficient documentation

## 2023-03-11 LAB — CBC WITH DIFFERENTIAL/PLATELET
Abs Immature Granulocytes: 0.02 10*3/uL (ref 0.00–0.07)
Basophils Absolute: 0 10*3/uL (ref 0.0–0.1)
Basophils Relative: 0 %
Eosinophils Absolute: 0 10*3/uL (ref 0.0–0.5)
Eosinophils Relative: 0 %
HCT: 43.3 % (ref 39.0–52.0)
Hemoglobin: 14.6 g/dL (ref 13.0–17.0)
Immature Granulocytes: 0 %
Lymphocytes Relative: 20 %
Lymphs Abs: 1.8 10*3/uL (ref 0.7–4.0)
MCH: 29.6 pg (ref 26.0–34.0)
MCHC: 33.7 g/dL (ref 30.0–36.0)
MCV: 87.7 fL (ref 80.0–100.0)
Monocytes Absolute: 0.7 10*3/uL (ref 0.1–1.0)
Monocytes Relative: 8 %
Neutro Abs: 6.6 10*3/uL (ref 1.7–7.7)
Neutrophils Relative %: 72 %
Platelets: 219 10*3/uL (ref 150–400)
RBC: 4.94 MIL/uL (ref 4.22–5.81)
RDW: 13.2 % (ref 11.5–15.5)
WBC: 9.2 10*3/uL (ref 4.0–10.5)
nRBC: 0 % (ref 0.0–0.2)

## 2023-03-11 LAB — SARS CORONAVIRUS 2 BY RT PCR: SARS Coronavirus 2 by RT PCR: NEGATIVE

## 2023-03-11 NOTE — ED Provider Notes (Incomplete)
[place] EMERGENCY DEPARTMENT AT [facility] Provider Note   CSN: [id number] Arrival date & time: 03/11/23  1942     History {Add pertinent medical, surgical, social history, OB history to HPI:1} Chief Complaint  Patient presents with  . Cough    Flu-like s/s productive-cough-green, congestion- wheezing. Started 4 days ago.      Lee Thomas is a 27 y.o. male presented with 5 days of generalized weakness, cough.  Patient states son was sick with lung infection at home.  Patient states for the first 2 days he had decreased appetite and barely ate and last night he began coughing with greenish sputum production and had 1 episode of nonbloody emesis.  Patient states he continues to have generalized weakness but that he is able to tolerate food and fluids orally now.  Patient also endorsed 3 episodes of loose stools yesterday as well.  Patient stated he thought he was wheezing yesterday but denies any history of asthma or COPD.  Patient denied any chest pain, shortness of breath, fever/chills, hematochezia, hematemesis, headache, vision changes, changes in sensation/motor skills  Home Medications Prior to Admission medications   Medication Sig Start Date End Date Taking? Authorizing Provider  HYDROcodone-acetaminophen (NORCO) 5-325 MG tablet Take 1 tablet by mouth every 6 (six) hours as needed. 04/24/21   Dartha Lodge, PA-C  ibuprofen (ADVIL) 600 MG tablet Take 1 tablet (600 mg total) by mouth every 6 (six) hours as needed. 04/24/21   Dartha Lodge, PA-C      Allergies    Patient has no known allergies.    Review of Systems   Review of Systems  Respiratory:  Positive for cough.   See HPI  Physical Exam Updated Vital Signs BP (!) 142/90 (BP Location: Right Arm)   Pulse 85   Temp 98.5 F (36.9 C)   Resp (!) 24   Ht  (1.854 m)   Wt 79.4 kg   SpO2 100%   BMI 23.09 kg/m  Physical Exam Vitals reviewed.  Constitutional:      General: He is not in acute  distress. HENT:     Head: Normocephalic and atraumatic.     Mouth/Throat:     Mouth: Mucous membranes are moist.     Pharynx: Posterior oropharyngeal erythema present.  Eyes:     Extraocular Movements: Extraocular movements intact.     Conjunctiva/sclera: Conjunctivae normal.     Pupils: Pupils are equal, round, and reactive to light.  Cardiovascular:     Rate and Rhythm: Normal rate and regular rhythm.     Pulses: Normal pulses.     Heart sounds: Normal heart sounds.     Comments: 2+ bilateral radial/dorsalis pedis pulses with regular rate Pulmonary:     Effort: Pulmonary effort is normal. No respiratory distress.     Breath sounds: Normal breath sounds.  Abdominal:     Palpations: Abdomen is soft.     Tenderness: There is no abdominal tenderness. There is no guarding or rebound.  Musculoskeletal:        General: Normal range of motion.     Cervical back: Normal range of motion and neck supple.     Right lower leg: No edema.     Left lower leg: No edema.     Comments: 5 out of 5 bilateral grip/leg extension strength  Lymphadenopathy:     Cervical: No cervical adenopathy.  Skin:    General: Skin is warm and dry.     Capillary  Refill: Capillary refill takes less than 2 seconds.  Neurological:     General: No focal deficit present.     Mental Status: He is alert and oriented to person, place, and time.     Comments: Sensation intact in all 4 limbs  Psychiatric:        Mood and Affect: Mood normal.     ED Results / Procedures / Treatments   Labs (all labs ordered are listed, but only abnormal results are displayed) Labs Reviewed  SARS CORONAVIRUS 2 BY RT PCR    EKG None  Radiology No results found.  Procedures Procedures  {Document cardiac monitor, telemetry assessment procedure when appropriate:1}  Medications Ordered in ED Medications - No data to display  ED Course/ Medical Decision Making/ A&P   {   Click here for ABCD2, HEART and other  calculatorsREFRESH Note before signing :1}                          Medical Decision Making Amount and/or Complexity of Data Reviewed Radiology: ordered.   Lee Thomas 27 y.o. presented today for nausea vomiting diarrhea generalized weakness and cough. Working DDx that I considered at this time includes, but not limited to, viral gastroenteritis, pneumonia, meningitis, electrolyte abnormalities, dehydration.  R/o DDx: Cannot be determined at this time  Review of prior external notes: 04/24/2021 ED  Unique Tests and My Interpretation:  COVID: Negative Chest x-ray: No acute cardiopulmonary changes CBC: Unremarkable CMP:  Discussion with Independent Historian: None  Discussion of Management of Tests: None  Risk: Low: based on diagnostic testing/clinical impression and treatment plan  Risk Stratification Score: None  Plan: Patient presented for nausea vomiting diarrhea generalized weakness and cough.  On exam patient is stable vitals and was in no acute distress.  Patient's physical exam was largely unremarkable and patient most likely has a viral gastroenteritis.  Patient did endorse productive sputum however and chest x-ray was ordered which was ultimately negative along with a negative COVID test.  Due to patient saying that he did not for 3 days and had nausea vomiting with loose stools yesterday a CMP and CBC were ordered to evaluate for any electrolyte abnormalities or anemia.  Patient denied any pain at the moment and some medications were withheld.  Patient stable at this time.  Patient was given return precautions.patient stable for discharge at this time.  Patient verbalized understanding of plan.   {Document critical care time when appropriate:1} {Document review of labs and clinical decision tools ie heart score, Chads2Vasc2 etc:1}  {Document your independent review of radiology images, and any outside records:1} {Document your discussion with family members,  caretakers, and with consultants:1} {Document social determinants of health affecting pt's care:1} {Document your decision making why or why not admission, treatments were needed:1} Final Clinical Impression(s) / ED Diagnoses Final diagnoses:  None    Rx / DC Orders ED Discharge Orders     None

## 2023-03-11 NOTE — ED Provider Notes (Addendum)
[facility] AT [facility] Provider Note   CSN: [id number] Arrival date & time: 03/11/23  1942     History  Chief Complaint  Patient presents with   Cough    Flu-like s/s productive-cough-green, congestion- wheezing. Started 4 days ago.      Lee Thomas is a 27 y.o. male presented with 5 days of generalized weakness, cough.  Patient states son was sick with lung infection at home.  Patient states for the first 2 days he had decreased appetite and barely ate and last night he began coughing with greenish sputum production and had 1 episode of nonbloody emesis.  Patient states he continues to have generalized weakness but that he is able to tolerate food and fluids orally now.  Patient also endorsed 3 episodes of loose stools yesterday as well.  Patient stated he thought he was wheezing yesterday but denies any history of asthma or COPD.  Patient denied any chest pain, shortness of breath, fever/chills, hematochezia, hematemesis, headache, vision changes, changes in sensation/motor skills  Home Medications Prior to Admission medications   Medication Sig Start Date End Date Taking? Authorizing Provider  HYDROcodone-acetaminophen (NORCO) 5-325 MG tablet Take 1 tablet by mouth every 6 (six) hours as needed. 04/24/21   Dartha Lodge, PA-C  ibuprofen (ADVIL) 600 MG tablet Take 1 tablet (600 mg total) by mouth every 6 (six) hours as needed. 04/24/21   Dartha Lodge, PA-C      Allergies    Patient has no known allergies.    Review of Systems   Review of Systems  Respiratory:  Positive for cough.   See HPI  Physical Exam Updated Vital Signs BP 134/81   Pulse 73   Temp 98.5 F (36.9 C)   Resp 20   Ht 6\' 1"  (1.854 m)   Wt 79.4 kg   SpO2 99%   BMI 23.09 kg/m  Physical Exam Vitals reviewed.  Constitutional:      General: He is not in acute distress. HENT:     Head: Normocephalic and atraumatic.     Mouth/Throat:     Mouth: Mucous membranes are  moist.     Pharynx: Posterior oropharyngeal erythema present.  Eyes:     Extraocular Movements: Extraocular movements intact.     Conjunctiva/sclera: Conjunctivae normal.     Pupils: Pupils are equal, round, and reactive to light.  Cardiovascular:     Rate and Rhythm: Normal rate and regular rhythm.     Pulses: Normal pulses.     Heart sounds: Normal heart sounds.     Comments: 2+ bilateral radial/dorsalis pedis pulses with regular rate Pulmonary:     Effort: Pulmonary effort is normal. No respiratory distress.     Breath sounds: Normal breath sounds.  Abdominal:     Palpations: Abdomen is soft.     Tenderness: There is no abdominal tenderness. There is no guarding or rebound.  Musculoskeletal:        General: Normal range of motion.     Cervical back: Normal range of motion and neck supple.     Right lower leg: No edema.     Left lower leg: No edema.     Comments: 5 out of 5 bilateral grip/leg extension strength  Lymphadenopathy:     Cervical: No cervical adenopathy.  Skin:    General: Skin is warm and dry.     Capillary Refill: Capillary refill takes less than 2 seconds.  Neurological:     General:  No focal deficit present.     Mental Status: He is alert and oriented to person, place, and time.     Comments: Sensation intact in all 4 limbs  Psychiatric:        Mood and Affect: Mood normal.     ED Results / Procedures / Treatments   Labs (all labs ordered are listed, but only abnormal results are displayed) Labs Reviewed  SARS CORONAVIRUS 2 BY RT PCR  CBC WITH DIFFERENTIAL/PLATELET  COMPREHENSIVE METABOLIC PANEL    EKG None  Radiology DG Chest 2 View  Result Date: 03/11/2023 CLINICAL DATA:  Productive cough, wheezing EXAM: CHEST - 2 VIEW COMPARISON:  07/26/2014 FINDINGS: The heart size and mediastinal contours are within normal limits. Both lungs are clear. The visualized skeletal structures are unremarkable. IMPRESSION: No active cardiopulmonary disease.  Electronically Signed   By: Helyn Numbers M.D.   On: 03/11/2023 23:32    Procedures Procedures    Medications Ordered in ED Medications - No data to display  ED Course/ Medical Decision Making/ A&P                             Medical Decision Making Amount and/or Complexity of Data Reviewed Labs: ordered. Radiology: ordered.   [name] 27 y.o. presented today for nausea vomiting diarrhea generalized weakness and cough. Working DDx that I considered at this time includes, but not limited to, viral gastroenteritis, pneumonia, meningitis, electrolyte abnormalities, dehydration.  R/o DDx: Cannot be determined at this time  Review of prior external notes: 04/24/2021 ED  Unique Tests and My Interpretation:  COVID: Negative Chest x-ray: No acute cardiopulmonary changes CBC: Unremarkable CMP: Unremarkable  Discussion with Independent Historian: None  Discussion of Management of Tests: None  Risk: Low: based on diagnostic testing/clinical impression and treatment plan  Risk Stratification Score: None  Plan: Patient presented for nausea vomiting diarrhea generalized weakness and cough.  On exam patient is stable vitals and was in no acute distress.  Patient's physical exam was largely unremarkable and patient most likely has a viral gastroenteritis.  Patient did endorse productive sputum however and chest x-ray was ordered which was ultimately negative along with a negative COVID test.  Due to patient saying that he did not for 3 days and had nausea vomiting with loose stools yesterday a CMP and CBC were ordered to evaluate for any electrolyte abnormalities or anemia.  Patient denied any pain at the moment and some medications were withheld.  Patient stable at this time.  Patient's labs came back reassuring without abnormalities.  Patient be discharged with outpatient follow-up.Patient is tolerating food and fluids orally and not endorsing any N/V at this time and so Zofran  will not be ordered at this time. I spoke to the patient about the importance of hydration and eating food as tolerated. Patient is stable for discharge and verbalized agreement to this plan.    Final Clinical Impression(s) / ED Diagnoses Final diagnoses:  Gastroenteritis    Rx / DC Orders ED Discharge Orders     None         Remi Deter 03/12/23 0018    Netta Corrigan, PA-C 03/12/23 0022    Nira Conn, MD 03/12/23 [phone]

## 2023-03-11 NOTE — ED Triage Notes (Signed)
Flu-like s/s productive-cough-green, congestion- wheezing. Started 4 days ago

## 2023-03-12 LAB — COMPREHENSIVE METABOLIC PANEL
ALT: 23 U/L (ref 0–44)
AST: 26 U/L (ref 15–41)
Albumin: 4.5 g/dL (ref 3.5–5.0)
Alkaline Phosphatase: 58 U/L (ref 38–126)
Anion gap: 10 (ref 5–15)
BUN: 14 mg/dL (ref 6–20)
CO2: 25 mmol/L (ref 22–32)
Calcium: 9.3 mg/dL (ref 8.9–10.3)
Chloride: 102 mmol/L (ref 98–111)
Creatinine, Ser: 1 mg/dL (ref 0.61–1.24)
GFR, Estimated: >60 mL/min (ref >60)
Glucose, Bld: 89 mg/dL (ref 70–99)
Potassium: 3.5 mmol/L (ref 3.5–5.1)
Sodium: 137 mmol/L (ref 135–145)
Total Bilirubin: 0.9 mg/dL (ref 0.3–1.2)
Total Protein: 7.8 g/dL (ref 6.5–8.1)

## 2023-03-12 NOTE — Discharge Instructions (Addendum)
Please follow-up with your primary care provider of your choosing I have attached you for your recent symptoms and ER visit.  Today your labs and imaging were reassuring and the most likely have a viral gastroenteritis which would explain your symptoms.  It is very important to remain hydrated and to continue eating food as tolerated.

## 2023-03-12 NOTE — ED Notes (Signed)
Pt verbalized understanding of d/c instructions, meds, and followup care. Denies questions. VSS, no distress noted. Steady gait to exit with all belongings.  ?
